# Patient Record
Sex: Male | Born: 2000 | Hispanic: Yes | Marital: Single | Smoking: Never smoker
Health system: Southern US, Community
[De-identification: ages and names within clinical notes are randomized; demographics above are authoritative.]

---
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
## 2021-04-03 ENCOUNTER — Ambulatory Visit: Payer: Medicaid Other | Admitting: Nurse Practitioner

## 2021-09-02 ENCOUNTER — Ambulatory Visit: Payer: Medicaid Other | Admitting: Nurse Practitioner

## 2022-01-05 ENCOUNTER — Ambulatory Visit: Payer: Medicaid Other | Admitting: Nurse Practitioner

## 2022-06-24 ENCOUNTER — Ambulatory Visit: Payer: Medicaid Other | Admitting: Nurse Practitioner

## 2022-06-24 NOTE — Progress Notes (Deleted)
   There were no vitals taken for this visit.   Subjective:    Patient ID: Charles Evener., male    DOB: 11/27/2000, 21 y.o.   MRN: [id number]  HPI: Charles Cleo Byron. is a 22 y.o. male  No chief complaint on file.  Patient presents to clinic to establish care with new PCP.  Introduced to Publishing rights manager role and practice setting.  All questions answered.  Discussed provider/patient relationship and expectations.  Patient reports a history of ***. Patient denies a history of: Hypertension, Elevated Cholesterol, Diabetes, Thyroid problems, Depression, Anxiety, Neurological problems, and Abdominal problems.   Active Ambulatory Problems    Diagnosis Date Noted   No Active Ambulatory Problems   Resolved Ambulatory Problems    Diagnosis Date Noted   No Resolved Ambulatory Problems   No Additional Past Medical History   No past surgical history on file. No family history on file.   Review of Systems  Per HPI unless specifically indicated above     Objective:    There were no vitals taken for this visit.  Wt Readings from Last 3 Encounters:  07/23/20 217 lb (98.4 kg) (97 %, Z= 1.87)*  09/03/16 180 lb (81.6 kg) (97 %, Z= 1.85)*   * Growth percentiles are based on CDC (Boys, 2-20 Years) data.    Physical Exam  Results for orders placed or performed in visit on 01/31/19  Novel Coronavirus, NAA ([facility])   Specimen: Nasopharyngeal(NP) swabs in vial transport medium   NASOPHARYNGE  TESTING  Result Value Ref Range   SARS-CoV-2, NAA Detected (A) Not Detected      Assessment & Plan:   Problem List Items Addressed This Visit   None    Follow up plan: No follow-ups on file.

## 2022-07-26 IMAGING — CT CT HEAD W/O CM
3 series · 15 of 46 positions shown, 18 images · non-contrast
Comparison: None.

CLINICAL DATA: Headache.

EXAM:
CT HEAD WITHOUT CONTRAST
TECHNIQUE: Contiguous axial images were obtained from the base of the skull
through the vertex without intravenous contrast.

[Series 3: head wo · axial · 0.44mm/px · z∈[+1017,+1137]mm · 9 of 29 slices shown, 12 images]
[im 3/29  brain]
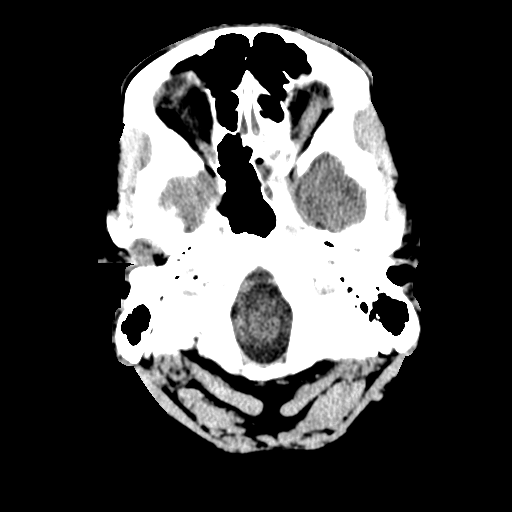
[im 3/29  bone]
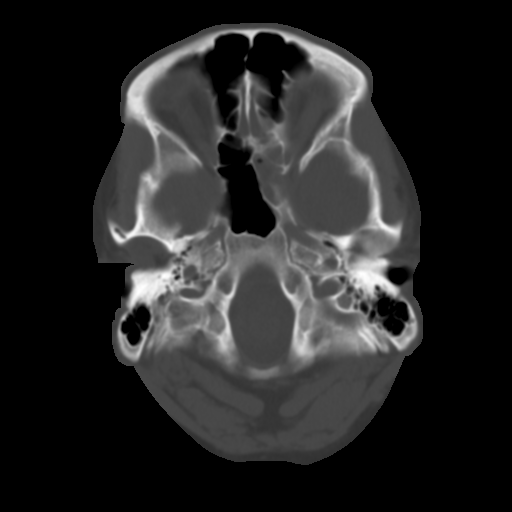
[im 6/29  brain]
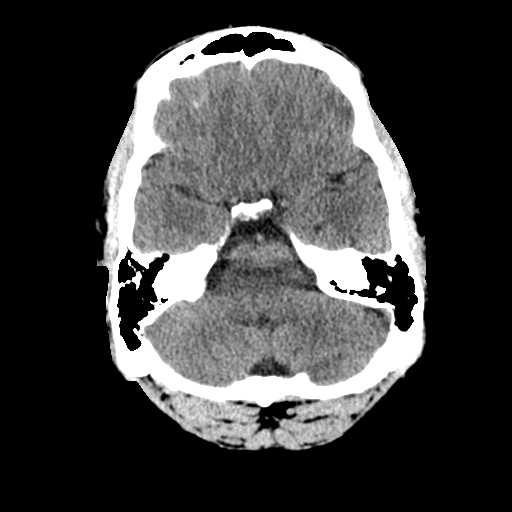
[im 9/29  brain]
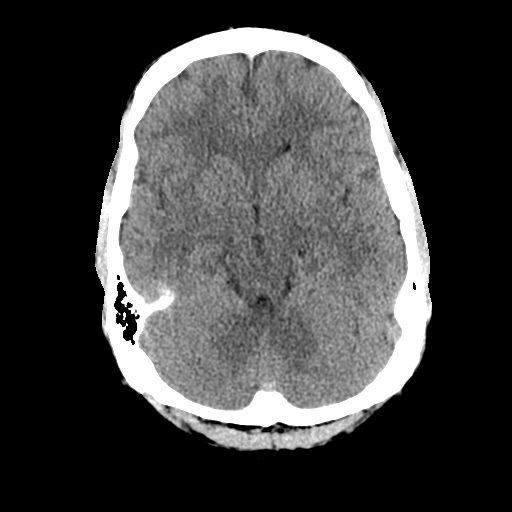
[im 12/29  brain]
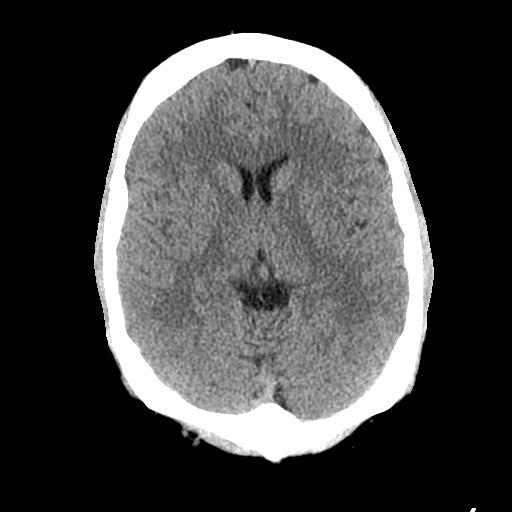
[im 15/29  brain]
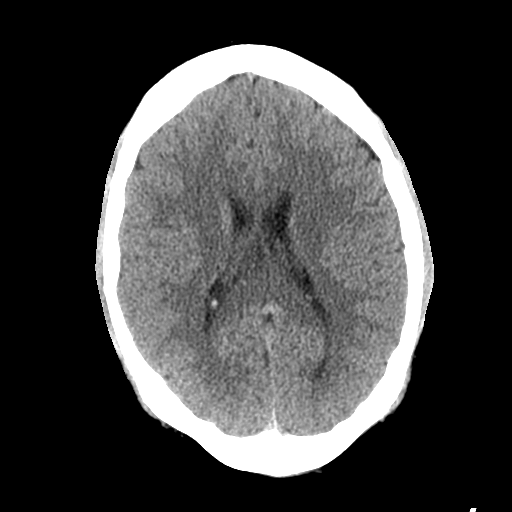
[im 15/29  bone]
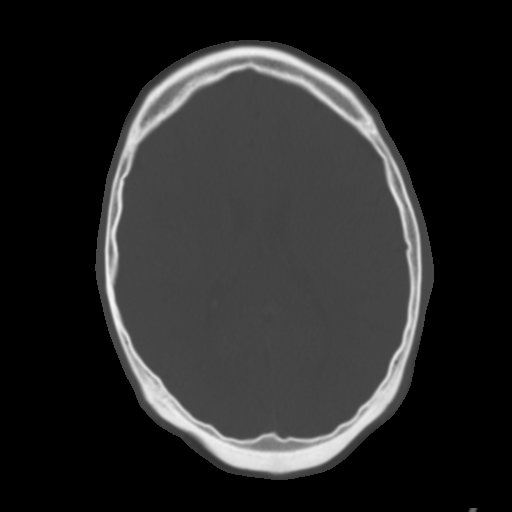
[im 18/29  brain]
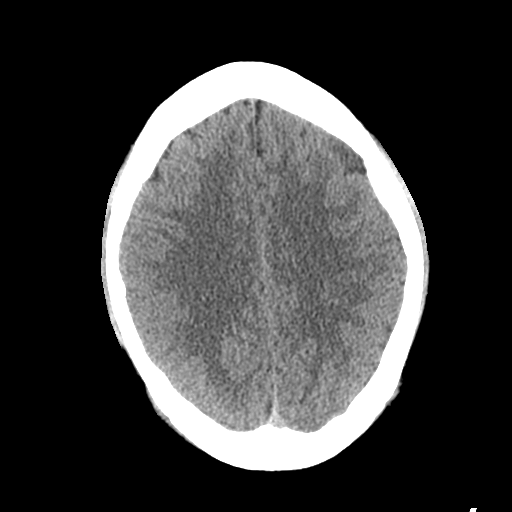
[im 21/29  brain]
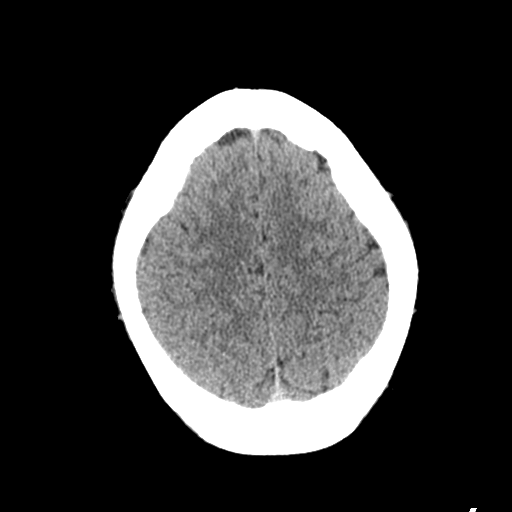
[im 24/29  brain]
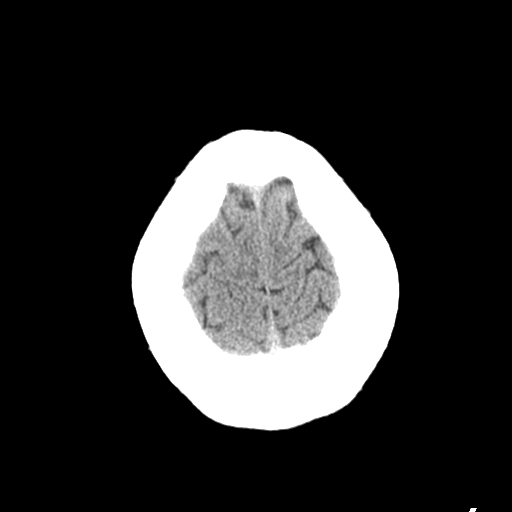
[im 27/29  brain]
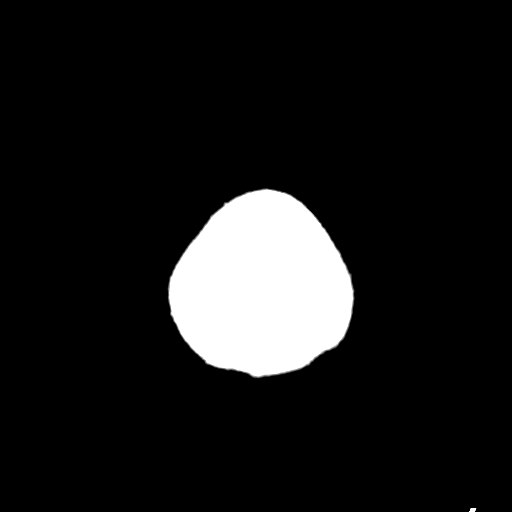
[im 27/29  bone]
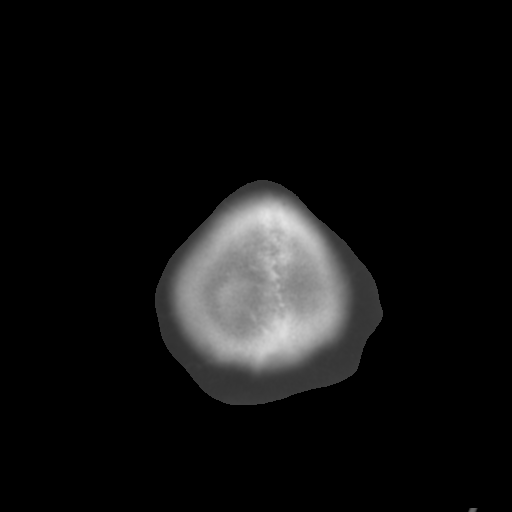

[Series 4: coronal soft tissue · coronal · 0.29mm/px · 3 of 70 slices shown]
[im 24/70  brain]
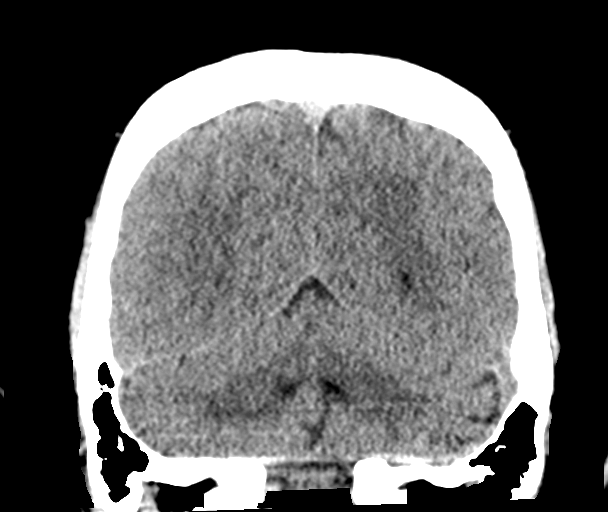
[im 31/70  brain]
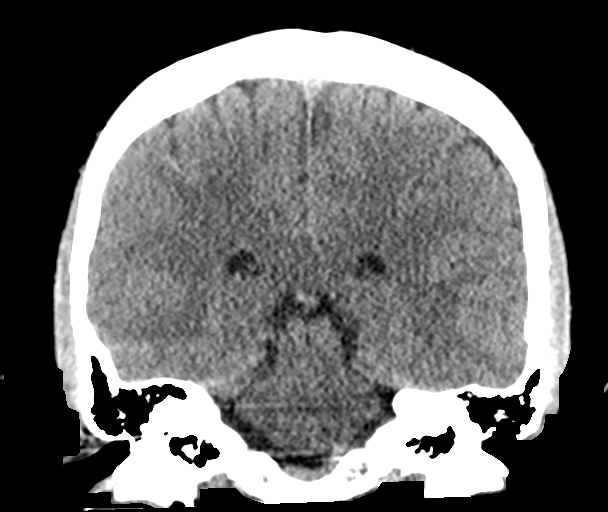
[im 39/70  brain]
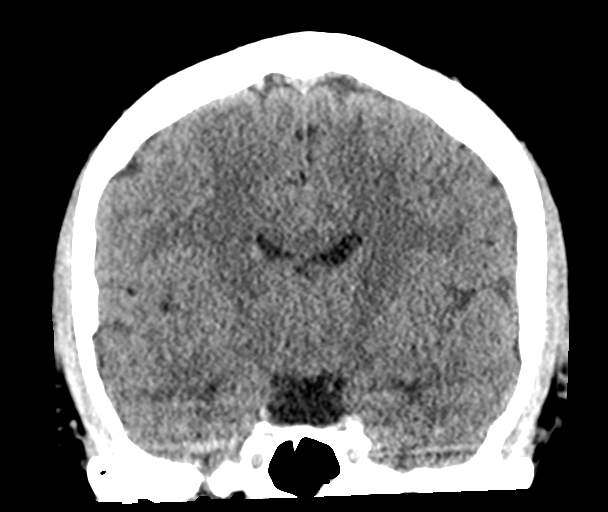

[Series 5: sagittal soft tissue · sagittal · 0.29mm/px · 3 of 60 slices shown]
[im 20/60  brain]
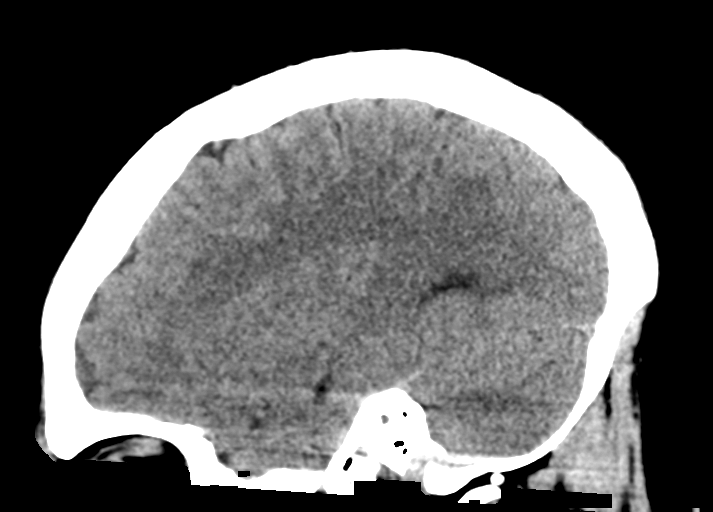
[im 30/60  brain]
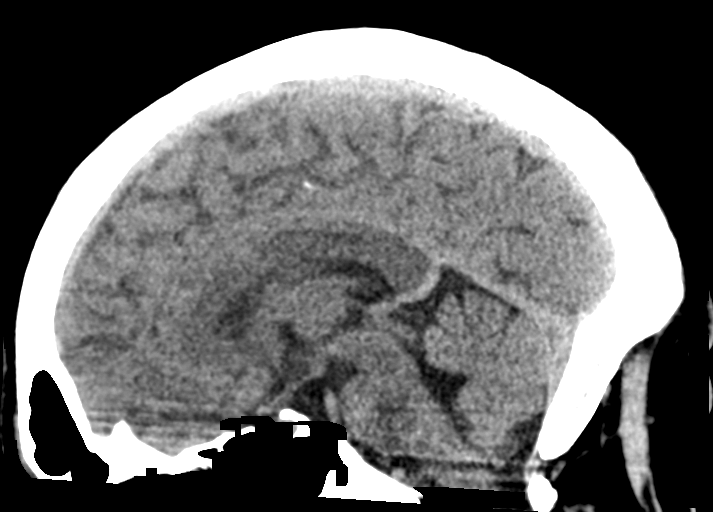
[im 40/60  brain]
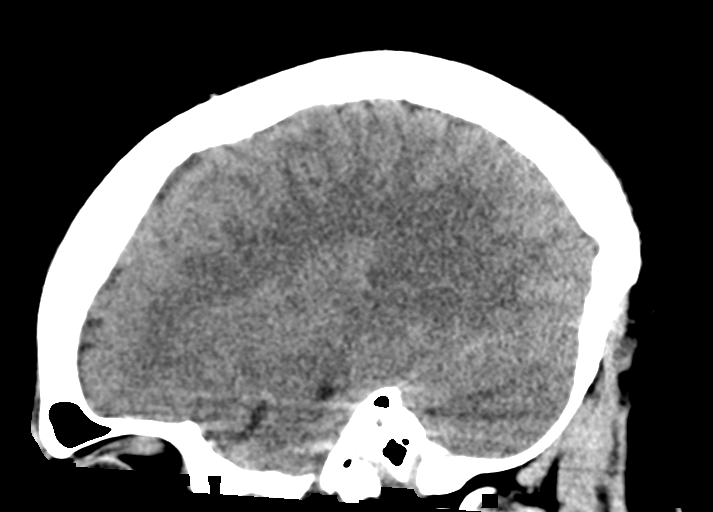

[15 of 46 positions shown; findings below may reference images not displayed]

FINDINGS: Brain: No evidence of acute infarction, hemorrhage, hydrocephalus,
extra-axial collection or mass lesion/mass effect.

Vascular: No hyperdense vessel or unexpected calcification.

Skull: Normal. Negative for fracture or focal lesion.

Sinuses/Orbits: Left ethmoid and sphenoid sinusitis is noted.

Other: None.
IMPRESSION: No acute intracranial abnormality noted. Left sphenoid and ethmoid
sinusitis is noted.

## 2022-08-27 ENCOUNTER — Emergency Department
Admission: EM | Admit: 2022-08-27 | Discharge: 2022-08-27 | Disposition: A | Discharge status: Home / Self Care | Payer: Medicaid Other | Attending: Emergency Medicine | Admitting: Emergency Medicine

## 2022-08-27 ENCOUNTER — Other Ambulatory Visit: Payer: Self-pay

## 2022-08-27 ENCOUNTER — Emergency Department: Payer: Medicaid Other

## 2022-08-27 DIAGNOSIS — Z23 Encounter for immunization: Secondary | ICD-10-CM | POA: Insufficient documentation

## 2022-08-27 DIAGNOSIS — S91331A Puncture wound without foreign body, right foot, initial encounter: Secondary | ICD-10-CM | POA: Insufficient documentation

## 2022-08-27 DIAGNOSIS — W450XXA Nail entering through skin, initial encounter: Secondary | ICD-10-CM | POA: Insufficient documentation

## 2022-08-27 DIAGNOSIS — T148XXA Other injury of unspecified body region, initial encounter: Secondary | ICD-10-CM

## 2022-08-27 DIAGNOSIS — S99921A Unspecified injury of right foot, initial encounter: Secondary | ICD-10-CM | POA: Diagnosis present

## 2022-08-27 MED ORDER — TETANUS-DIPHTH-ACELL PERTUSSIS 5-2.5-18.5 LF-MCG/0.5 IM SUSY
0.5000 mL | PREFILLED_SYRINGE | Freq: Once | INTRAMUSCULAR | Status: AC
  Administered 2022-08-27: 0.5 mL via INTRAMUSCULAR
  Filled 2022-08-27: qty 0.5

## 2022-08-27 MED ORDER — CEPHALEXIN 500 MG PO CAPS: 500.0000 mg | ORAL_CAPSULE | Freq: Four times a day (QID) | ORAL | 0 refills | Status: AC

## 2022-08-27 NOTE — Discharge Instructions (Addendum)
Take Keflex four times daily for the next seven days.  

## 2022-08-27 NOTE — ED Notes (Signed)
Alert, NAD, calm, interactive. Puncture to bottom of R foot. Soaked in saline and betadine. Tdap >10 years. Ordered per protocol.

## 2022-08-27 NOTE — ED Provider Notes (Signed)
[facility] Provider Note  Patient Contact: 5:28 PM (approximate)   History   Foot Injury (Right)   HPI  Charles Lee. is a 22 y.o. male presents to the emergency department with right foot pain after patient stepped on a nail.  Injury occurred today and patient is uncertain of last tetanus shot.  No numbness or tingling along the plantar aspect of the right foot.  No similar injuries in the past.      Physical Exam   Triage Vital Signs: ED Triage Vitals  Encounter Vitals Group     BP 08/27/22 1614 130/78     Systolic BP Percentile --      Diastolic BP Percentile --      Pulse Rate 08/27/22 1615 65     Resp 08/27/22 1612 16     Temp 08/27/22 1612 98 F (36.7 C)     Temp Source 08/27/22 1612 Oral     SpO2 08/27/22 1612 98 %     Weight 08/27/22 1612 216 lb 0.8 oz (98 kg)     Height 08/27/22 1612 5\' 7"  (1.702 m)     Head Circumference --      Peak Flow --      Pain Score 08/27/22 1612 6     Pain Loc --      Pain Education --      Exclude from Growth Chart --     Most recent vital signs: Vitals:   08/27/22 1614 08/27/22 1615  BP: 130/78   Pulse:  65  Resp:    Temp:    SpO2:       General: Alert and in no acute distress. Eyes:  PERRL. EOMI. Head: No acute traumatic findings ENT:      Nose: No congestion/rhinnorhea.      Mouth/Throat: Mucous membranes are moist.  Neck: No stridor. No cervical spine tenderness to palpation. Cardiovascular:  Good peripheral perfusion Respiratory: Normal respiratory effort without tachypnea or retractions. Lungs CTAB. Good air entry to the bases with no decreased or absent breath sounds. Gastrointestinal: Bowel sounds 4 quadrants. Soft and nontender to palpation. No guarding or rigidity. No palpable masses. No distention. No CVA tenderness. Musculoskeletal: Full range of motion to all extremities.  Neurologic:  No gross focal neurologic deficits are appreciated.  Skin: Patient has small  puncture wound along the plantar aspect of the right foot.  Palpable dorsalis pedis pulse bilaterally and symmetrically. Other:   ED Results / Procedures / Treatments   Labs (all labs ordered are listed, but only abnormal results are displayed) Labs Reviewed - No data to display      RADIOLOGY  I personally viewed and evaluated these images as part of my medical decision making, as well as reviewing the written report by the radiologist.  ED Provider Interpretation: X-ray indicates no retained foreign body or other concerning findings.   PROCEDURES:  Critical Care performed: No  Procedures   MEDICATIONS ORDERED IN ED: Medications  Tdap (BOOSTRIX) injection 0.5 mL (0.5 mLs Intramuscular Given 08/27/22 1702)     IMPRESSION / MDM / ASSESSMENT AND PLAN / ED COURSE  I reviewed the triage vital signs and the nursing notes.                              Assessment and plan Puncture wound 22 year old male presents to the emergency department after he stepped on a rusty nail.  X-ray shows no acute abnormality.  Recommended supportive care at home and patient was discharged with Keflex.  Tetanus status was updated prior to discharge.     FINAL CLINICAL IMPRESSION(S) / ED DIAGNOSES   Final diagnoses:  Puncture wound     Rx / DC Orders   ED Discharge Orders          Ordered    cephALEXin (KEFLEX) 500 MG capsule  4 times daily        08/27/22 1725             Note:  This document was prepared using Dragon voice recognition software and may include unintentional dictation errors.   Pia Mau Halesite, Cordelia Poche 08/27/22 1730    Dionne Bucy, MD 08/27/22 2007

## 2022-08-27 NOTE — ED Notes (Signed)
38 min betadine/ saline soak complete

## 2022-08-27 NOTE — ED Triage Notes (Signed)
Pt to ed from home via POV for stepping on a rusty nail at work today. It pierced through the boot and through the skin on the bottom of his foot. Pt is caox4, in no acute distress and ambulatory in triage.
